# Patient Record
Sex: Female | Born: 1995 | Race: White | Hispanic: No | Marital: Married | Smoking: Never smoker
Health system: Southern US, Community
[De-identification: ages and names within clinical notes are randomized; demographics above are authoritative.]

---

## 2019-12-13 ENCOUNTER — Encounter (HOSPITAL_COMMUNITY): Payer: Self-pay | Admitting: Emergency Medicine

## 2019-12-13 ENCOUNTER — Other Ambulatory Visit: Payer: Self-pay

## 2019-12-13 ENCOUNTER — Emergency Department (HOSPITAL_COMMUNITY)
Admission: EM | Admit: 2019-12-13 | Discharge: 2019-12-13 | Discharge status: Against Medical Advice | Payer: BLUE CROSS/BLUE SHIELD | Attending: Emergency Medicine | Admitting: Emergency Medicine

## 2019-12-13 ENCOUNTER — Emergency Department (HOSPITAL_COMMUNITY): Payer: BLUE CROSS/BLUE SHIELD

## 2019-12-13 DIAGNOSIS — Z5321 Procedure and treatment not carried out due to patient leaving prior to being seen by health care provider: Secondary | ICD-10-CM | POA: Diagnosis not present

## 2019-12-13 DIAGNOSIS — R0789 Other chest pain: Secondary | ICD-10-CM | POA: Diagnosis present

## 2019-12-13 LAB — BASIC METABOLIC PANEL
Anion gap: 12 (ref 5–15)
BUN: 18 mg/dL (ref 6–20)
CO2: 23 mmol/L (ref 22–32)
Calcium: 9.2 mg/dL (ref 8.9–10.3)
Chloride: 100 mmol/L (ref 98–111)
Creatinine, Ser: 1.22 mg/dL — ABNORMAL HIGH (ref 0.44–1.00)
GFR calc Af Amer: >60 mL/min (ref >60)
GFR calc non Af Amer: >60 mL/min (ref >60)
Glucose, Bld: 95 mg/dL (ref 70–99)
Potassium: 4.1 mmol/L (ref 3.5–5.1)
Sodium: 135 mmol/L (ref 135–145)

## 2019-12-13 LAB — CBC
HCT: 39.8 % (ref 36.0–46.0)
Hemoglobin: 12.4 g/dL (ref 12.0–15.0)
MCH: 26.8 pg (ref 26.0–34.0)
MCHC: 31.2 g/dL (ref 30.0–36.0)
MCV: 86.1 fL (ref 80.0–100.0)
Platelets: 377 10*3/uL (ref 150–400)
RBC: 4.62 MIL/uL (ref 3.87–5.11)
RDW: 12.4 % (ref 11.5–15.5)
WBC: 9.4 10*3/uL (ref 4.0–10.5)
nRBC: 0 % (ref 0.0–0.2)

## 2019-12-13 LAB — I-STAT BETA HCG BLOOD, ED (MC, WL, AP ONLY): I-stat hCG, quantitative: <5 m[IU]/mL (ref <5)

## 2019-12-13 LAB — TROPONIN I (HIGH SENSITIVITY): Troponin I (High Sensitivity): <2 ng/L (ref <18)

## 2019-12-13 MED ORDER — SODIUM CHLORIDE 0.9% FLUSH: 3.0000 mL | Freq: Once | INTRAVENOUS | Status: DC

## 2019-12-13 NOTE — ED Triage Notes (Signed)
Pt c/o back pain that started Friday, epigastric pain that started over the weekend and chest pain yesterday. Per pt, pain worse when breathing.

## 2019-12-14 ENCOUNTER — Encounter (HOSPITAL_COMMUNITY): Payer: Self-pay

## 2019-12-14 ENCOUNTER — Emergency Department (HOSPITAL_COMMUNITY): Payer: BLUE CROSS/BLUE SHIELD

## 2019-12-14 ENCOUNTER — Emergency Department (HOSPITAL_COMMUNITY)
Admission: EM | Admit: 2019-12-14 | Discharge: 2019-12-14 | Disposition: A | Discharge status: Home / Self Care | Payer: BLUE CROSS/BLUE SHIELD | Attending: Emergency Medicine | Admitting: Emergency Medicine

## 2019-12-14 DIAGNOSIS — R109 Unspecified abdominal pain: Secondary | ICD-10-CM | POA: Insufficient documentation

## 2019-12-14 DIAGNOSIS — M549 Dorsalgia, unspecified: Secondary | ICD-10-CM | POA: Diagnosis not present

## 2019-12-14 DIAGNOSIS — R079 Chest pain, unspecified: Secondary | ICD-10-CM

## 2019-12-14 DIAGNOSIS — R0789 Other chest pain: Secondary | ICD-10-CM | POA: Diagnosis not present

## 2019-12-14 LAB — D-DIMER, QUANTITATIVE: D-Dimer, Quant: 1.09 ug/mL-FEU — ABNORMAL HIGH (ref 0.00–0.50)

## 2019-12-14 MED ORDER — SODIUM CHLORIDE 0.9 % IV BOLUS
500.0000 mL | Freq: Once | INTRAVENOUS | Status: AC
  Administered 2019-12-14: 500 mL via INTRAVENOUS

## 2019-12-14 MED ORDER — IOHEXOL 350 MG/ML SOLN
100.0000 mL | Freq: Once | INTRAVENOUS | Status: AC | PRN
  Administered 2019-12-14: 85 mL via INTRAVENOUS

## 2019-12-14 NOTE — ED Provider Notes (Signed)
[facility] EMERGENCY DEPARTMENT Provider Note   CSN: [id number] Arrival date & time: 12/14/19  0008     History Chief Complaint  Patient presents with  . Chest Pain    Nolan Lasser is a 24 y.o. female.  HPI Patient presents with chest pain.  States it started in the epigastric area and right upper abdomen.  Then moved to chest.  Has had over the last 2 to 3 days.  Worse with breathing.  Worse with certain movements.  Does go to the back little bit.  No nausea or vomiting.  No fevers.  No cough.  Not exertional.  [birth year] her birthday.  Had been in the ER for previous visit but not been seen.  Lab work done at that time.  Does not smoke.  No swelling in her legs.  Denies early family history of cardiac disease.  Pain is now in her left upper chest wall although she has had some pain in the right chest wall also.    History reviewed. No pertinent past medical history.  There are no problems to display for this patient.   History reviewed. No pertinent surgical history.   OB History   No obstetric history on file.     No family history on file.  Social History   Tobacco Use  . Smoking status: Never Smoker  Substance Use Topics  . Alcohol use: Not Currently  . Drug use: Never    Home Medications Prior to Admission medications   Not on File    Allergies    Penicillins  Review of Systems   Review of Systems  Constitutional: Negative for appetite change.  HENT: Negative for congestion.   Respiratory: Negative for shortness of breath.   Cardiovascular: Positive for chest pain.  Gastrointestinal: Positive for abdominal pain.  Musculoskeletal: Positive for back pain.  Skin: Negative for rash.  Neurological: Negative for weakness.  Psychiatric/Behavioral: Negative for confusion.    Physical Exam Updated Vital Signs BP 125/75 (BP Location: Left Arm)   Pulse 97   Temp 98 F (36.7 C) (Oral)   Resp 14   LMP 11/28/2019   SpO2  100%   Physical Exam Vitals reviewed.  Constitutional:      Appearance: She is well-developed.  HENT:     Head: Atraumatic.  Cardiovascular:     Rate and Rhythm: Regular rhythm.  Pulmonary:     Breath sounds: No wheezing, rhonchi or rales.  Chest:     Chest wall: Tenderness present.     Comments: Some tenderness to left upper anterior chest wall.  No crepitance.  No deformity.  No rash. Abdominal:     Tenderness: There is no abdominal tenderness.     Comments: No abdominal tenderness in epigastric right upper quadrant or rest of abdomen.  Skin:    General: Skin is warm.     Capillary Refill: Capillary refill takes less than 2 seconds.  Neurological:     Mental Status: She is alert and oriented to person, place, and time.     ED Results / Procedures / Treatments   Labs (all labs ordered are listed, but only abnormal results are displayed) Labs Reviewed  D-DIMER, QUANTITATIVE (NOT AT [facility]) - Abnormal; Notable for the following components:      Result Value   D-Dimer, Quant 1.09 (*)    All other components within normal limits    EKG None  Radiology DG Chest 2 View  Result Date: 12/13/2019 CLINICAL DATA:  Chest pain EXAM: CHEST - 2 VIEW COMPARISON:  None. FINDINGS: Lungs are clear. Heart size and pulmonary vascularity are normal. No adenopathy. There is midthoracic dextroscoliosis. There is mild scoliosis in the upper lumbar region as well. No pneumothorax. IMPRESSION: Lungs clear.  Cardiac silhouette normal.  Areas of scoliosis noted. Electronically Signed   By: Bretta Bang III M.D.   On: 12/13/2019 18:48   CT Angio Chest PE W and/or Wo Contrast  Result Date: 12/14/2019 CLINICAL DATA:  24 year old female with left upper anterior chest pain, left clavicle pain. Shortness of breath. EXAM: CT ANGIOGRAPHY CHEST WITH CONTRAST TECHNIQUE: Multidetector CT imaging of the chest was performed using the standard protocol during bolus administration of intravenous contrast.  Multiplanar CT image reconstructions and MIPs were obtained to evaluate the vascular anatomy. CONTRAST:  18mL OMNIPAQUE IOHEXOL 350 MG/ML SOLN COMPARISON:  None. FINDINGS: Cardiovascular: Good contrast bolus timing in the pulmonary arterial tree. No focal filling defect identified in the pulmonary arteries to suggest acute pulmonary embolism. No cardiomegaly. Negative visible aorta. No coronary artery atherosclerosis is evident. Mediastinum/Nodes: Negative.  Small volume residual thymus. Lungs/Pleura: Major airways are patent. Minimal to mild atelectasis at the left lung base. No pleural effusion and otherwise both lungs appear clear. Upper Abdomen: Negative visible liver, spleen, and bowel in the upper abdomen. Musculoskeletal: Thoracic inlet, visible clavicular region soft tissues and axillary lymph nodes appear normal. Mild to moderate scoliosis, but no other osseous abnormality. Review of the MIP images confirms the above findings. IMPRESSION: 1. Negative for acute pulmonary embolus. Negative Chest CTA aside from mild left lung base atelectasis. 2. Scoliosis. Electronically Signed   By: Odessa Fleming M.D.   On: 12/14/2019 14:32    Procedures Procedures (including critical care time)  Medications Ordered in ED Medications  sodium chloride 0.9 % bolus 500 mL (0 mLs Intravenous Stopped 12/14/19 1400)  iohexol (OMNIPAQUE) 350 MG/ML injection 100 mL (85 mLs Intravenous Contrast Given 12/14/19 1419)    ED Course  I have reviewed the triage vital signs and the nursing notes.  Pertinent labs & imaging results that were available during my care of the patient were reviewed by me and considered in my medical decision making (see chart for details).    MDM Rules/Calculators/A&P                          Patient presents with chest pain.  Has had for the last few days.  Started in upper chest and then went up more to the left.  Worse with breathing.  No nausea or vomiting.  No diarrhea.  No abdominal tenderness.   No fevers.  Came to the ER yesterday but was not seen due to the wait.  Did have blood work and x-ray done which were reassuring.  D-dimer done to the pleuritic pain today.  It was elevated.  CT scan done and reassuring.  No blood clot.  No other cause.  Will discharge home.  Musculoskeletal versus potentially reflux also.  Gallbladder felt less likely.  Discharge home. Final Clinical Impression(s) / ED Diagnoses Final diagnoses:  Nonspecific chest pain    Rx / DC Orders ED Discharge Orders    None       Benjiman Core, MD 12/14/19 1450

## 2019-12-14 NOTE — ED Triage Notes (Signed)
Pt was here earlier and LWBS, pain got worse when she got home and she returned

## 2019-12-14 NOTE — ED Notes (Signed)
Patient verbalizes understanding of discharge instructions. Opportunity for questioning and answers were provided. Armband removed by staff, pt discharged from ED ambulatory with significant other to transport pt home

## 2020-06-07 ENCOUNTER — Other Ambulatory Visit: Payer: Self-pay | Admitting: Ophthalmology

## 2020-06-07 DIAGNOSIS — H44113 Panuveitis, bilateral: Secondary | ICD-10-CM

## 2020-07-05 ENCOUNTER — Other Ambulatory Visit: Payer: Self-pay | Admitting: Ophthalmology

## 2020-07-05 ENCOUNTER — Ambulatory Visit
Admission: RE | Admit: 2020-07-05 | Discharge: 2020-07-05 | Disposition: A | Discharge status: Home / Self Care | Payer: BLUE CROSS/BLUE SHIELD | Source: Ambulatory Visit | Attending: Ophthalmology | Admitting: Ophthalmology

## 2020-07-05 ENCOUNTER — Other Ambulatory Visit: Payer: Self-pay

## 2020-07-05 DIAGNOSIS — H44113 Panuveitis, bilateral: Secondary | ICD-10-CM

## 2020-07-05 MED ORDER — GADOBENATE DIMEGLUMINE 529 MG/ML IV SOLN
20.0000 mL | Freq: Once | INTRAVENOUS | Status: AC | PRN
  Administered 2020-07-05: 20 mL via INTRAVENOUS

## 2021-07-14 IMAGING — MR MR ORBITS WO/W CM
14 of 16 series · 37 of 48 positions shown · IV contrast (multihance)
Comparison: None.

CLINICAL DATA: Panuveitis, bilateral.

EXAM:
MRI HEAD AND ORBITS WITHOUT AND WITH CONTRAST
TECHNIQUE: Multiplanar, multiecho pulse sequences of the brain and surrounding
structures were obtained without and with intravenous contrast.
Multiplanar, multiecho pulse sequences of the orbits and surrounding
structures were obtained including fat saturation techniques, before
and after intravenous contrast administration.
CONTRAST:  20mL MULTIHANCE GADOBENATE DIMEGLUMINE 529 MG/ML IV SOLN

[Series 2: t1_se_sag · sagittal · 5.0mm · 0.45mm/px · 1 of 21 slices shown]
[im 1/21]
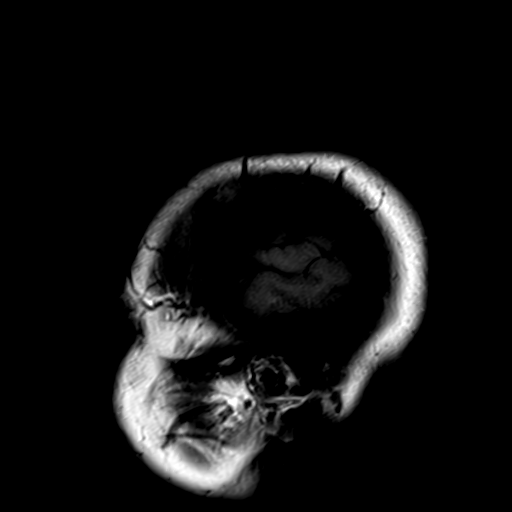

[Series 3: t2_tse_tra_512 · axial · 5.0mm · 0.60mm/px · 1 of 24 slices shown]
[im 1/24]
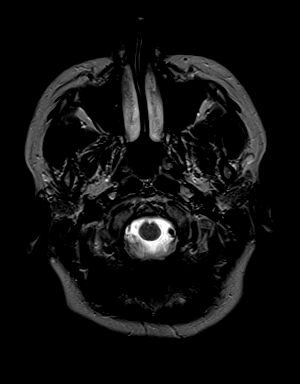

[Series 4: ep2d_diff_3 · axial · 3.0mm · 1.80mm/px · z∈[-64,+77]mm · 6 of 96 slices shown]
[im 1/96]
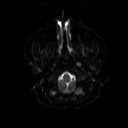
[im 20/96]
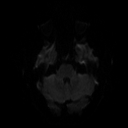
[im 39/96]
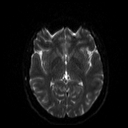
[im 58/96]
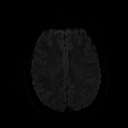
[im 77/96]
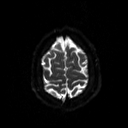
[im 96/96]
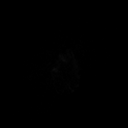

[Series 5: ep2d_diff_3_adc · axial · 3.0mm · 1.80mm/px · z∈[-64,+77]mm · 3 of 47 slices shown]
[im 1/47]
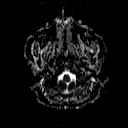
[im 24/47]
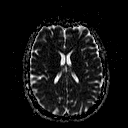
[im 47/47]
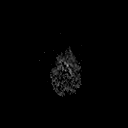

[Series 7: swi_images · axial · 2.0mm · 0.90mm/px · z∈[-65,+77]mm · 4 of 72 slices shown]
[im 1/72]
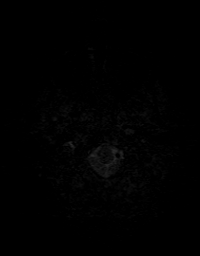
[im 24/72]
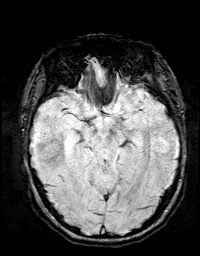
[im 48/72]
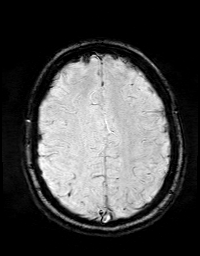
[im 72/72]
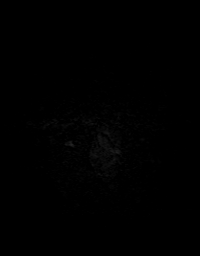

[Series 8: FLAIR · axial · 3.0mm · 0.43mm/px · z∈[-61,+73]mm · 2 of 30 slices shown]
[im 1/30]
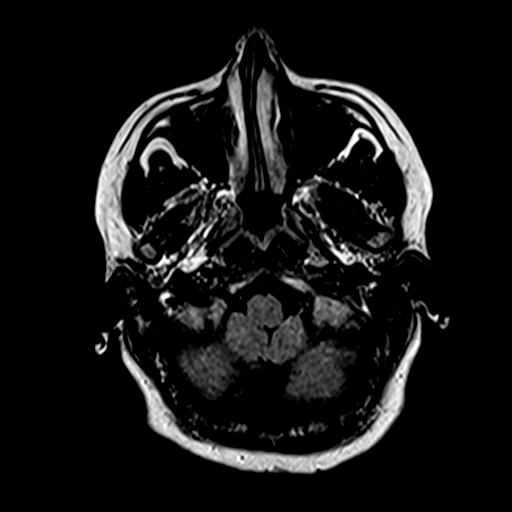
[im 30/30]
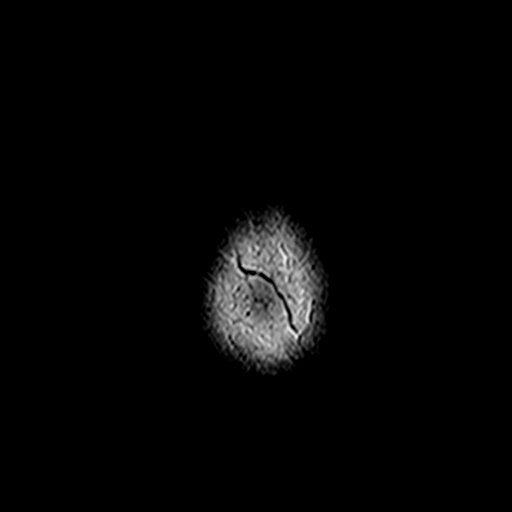

[Series 9: t1_mpr_tra · axial · 1.0mm · 0.72mm/px · z∈[-65,+77]mm · 8 of 144 slices shown]
[im 1/144]
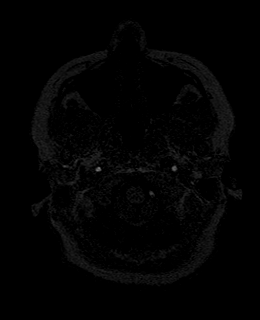
[im 18/144]
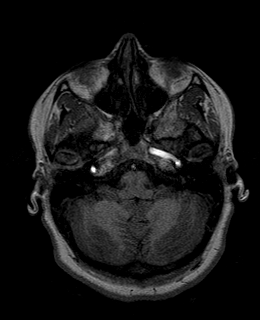
[im 36/144]
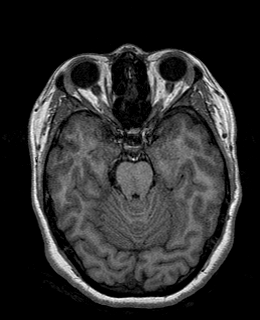
[im 54/144]
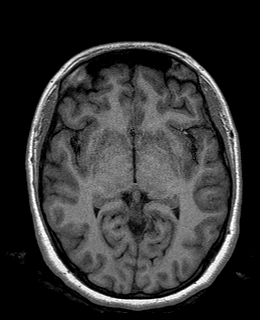
[im 90/144]
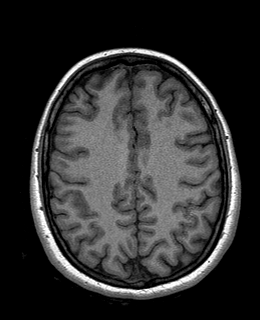
[im 108/144]
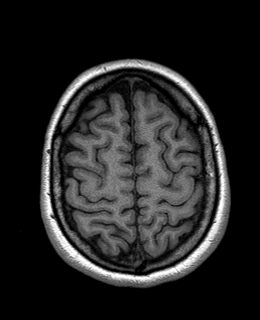
[im 126/144]
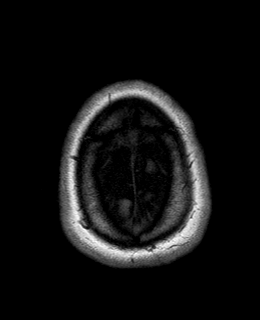
[im 144/144]
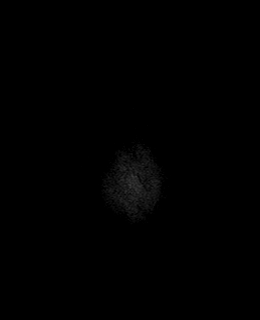

[Series 10: t1_tse cor_3mm · coronal · 3.0mm · 0.35mm/px · 2 of 30 slices shown]
[im 1/30]
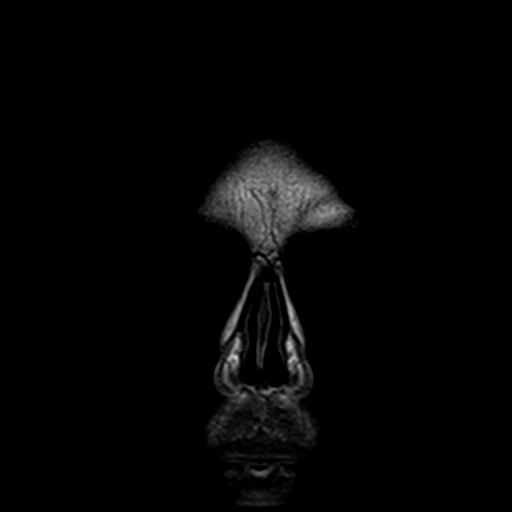
[im 30/30]
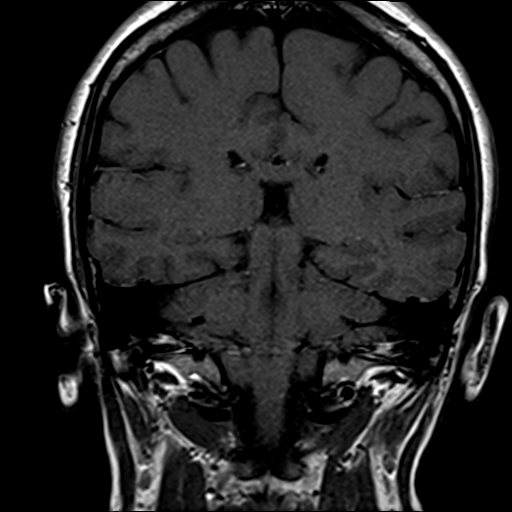

[Series 11: t2_cor_fs_3mm · coronal · 3.0mm · 0.35mm/px · 2 of 30 slices shown]
[im 1/30]
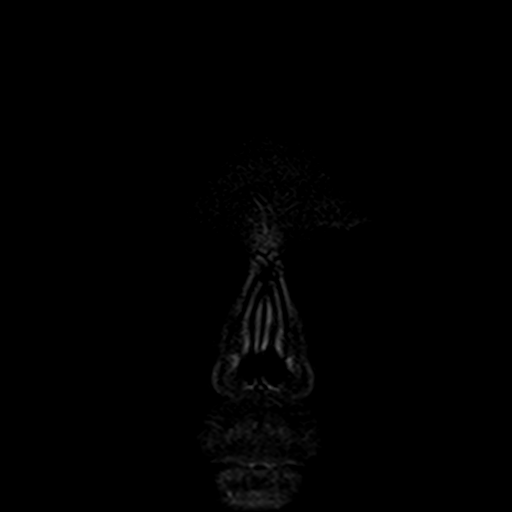
[im 30/30]
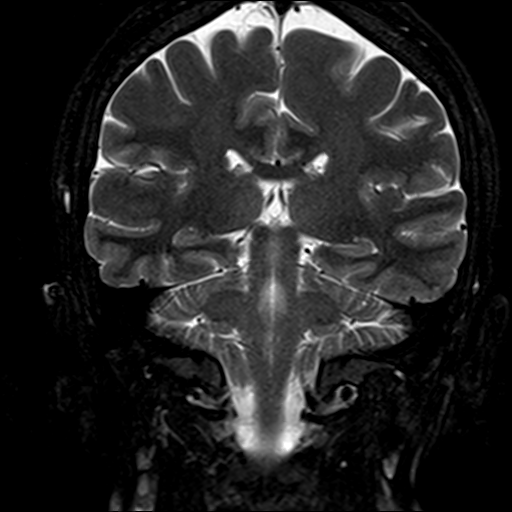

[Series 12: t1_tse axial_3mm · axial · 3.0mm · 0.35mm/px · 1 of 20 slices shown]
[im 1/20]
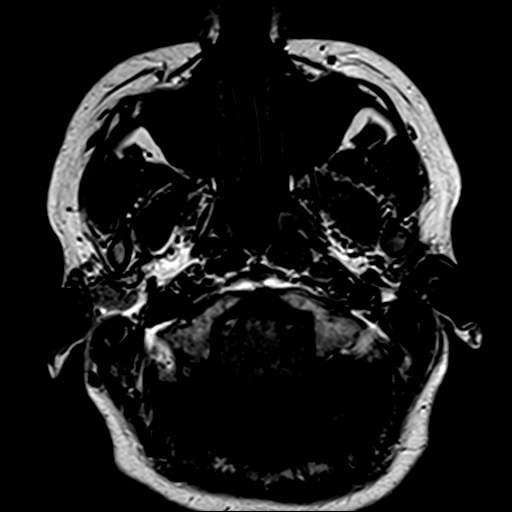

[Series 13: t2_ax_fs_3mm · axial · 3.0mm · 0.35mm/px · 1 of 20 slices shown]
[im 1/20]
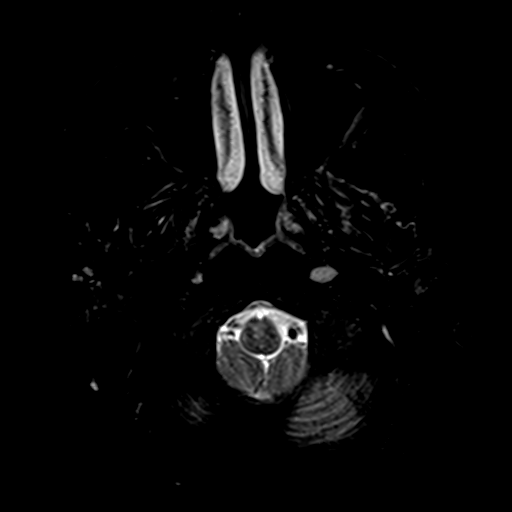

[Series 14: T2 · coronal · 5.0mm · 0.45mm/px · 2 of 26 slices shown]
[im 1/26]
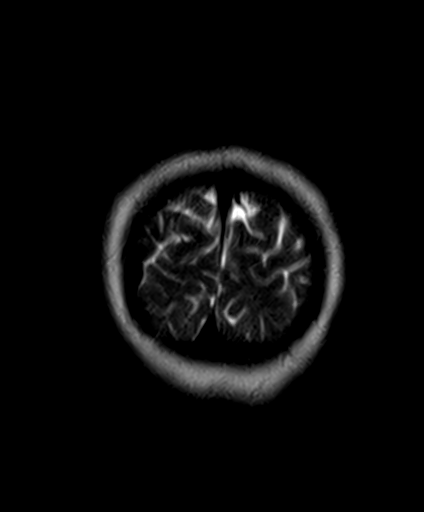
[im 26/26]
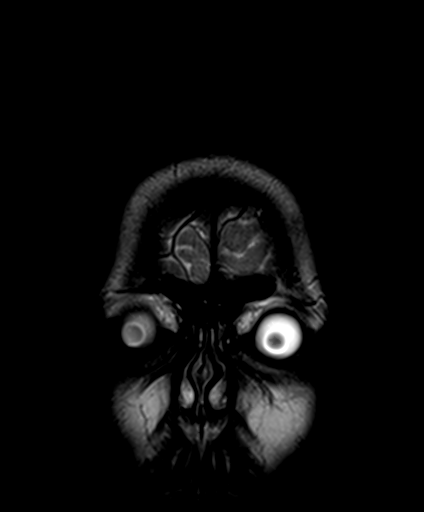

[Series 15: post_t1_tse cor_fs_3mm · coronal · 3.0mm · 0.35mm/px · 2 of 30 slices shown]
[im 1/30]
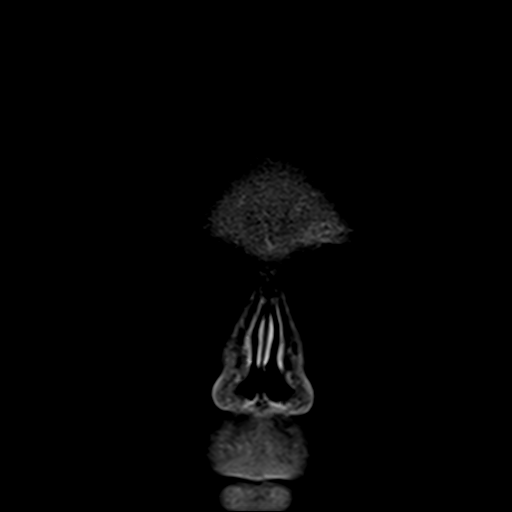
[im 30/30]
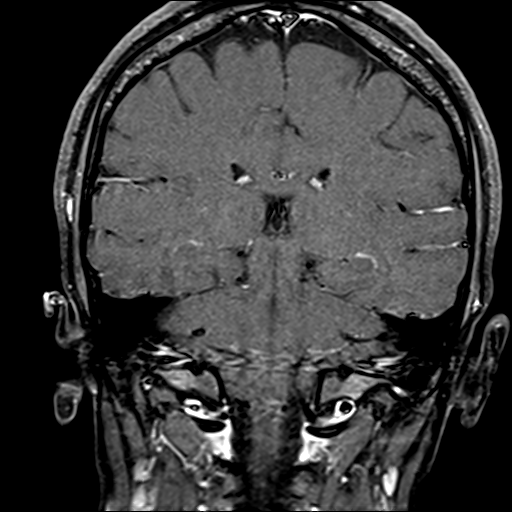

[Series 18: T1 post-contrast · coronal · 5.0mm · 0.45mm/px · 2 of 26 slices shown]
[im 1/26]
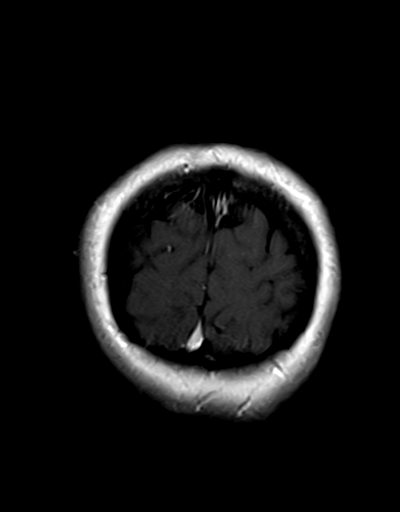
[im 26/26]
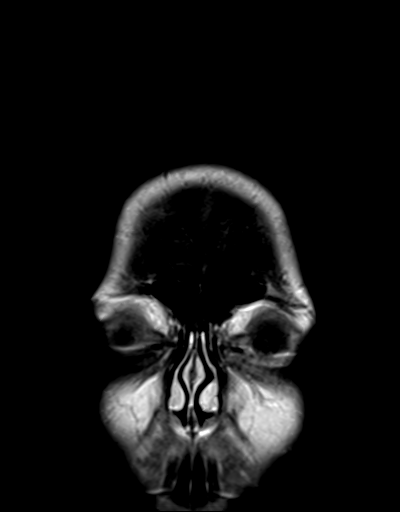

[37 of 48 positions shown; findings below may reference images not displayed]

FINDINGS: MRI HEAD FINDINGS

Brain: No acute infarction, hemorrhage, hydrocephalus, extra-axial
collection or mass lesion. The brain parenchyma has normal
morphology and signal characteristics. No focus of abnormal contrast
enhancement.

Vascular: Normal flow voids.

Skull and upper cervical spine: Normal marrow signal.

MRI ORBITS FINDINGS

Orbits: No traumatic or inflammatory finding. Globes, optic nerves,
orbital fat, extraocular muscles, vascular structures, and lacrimal
glands are normal.

Visualized sinuses: Clear.

Soft tissues: Negative.
IMPRESSION: Unremarkable MRI of the brain and orbits.
